# Patient Record
Sex: Male | Born: 1965 | Race: White | Marital: Single | Smoking: Never smoker
Health system: Southern US, Community
[De-identification: ages and names within clinical notes are randomized; demographics above are authoritative.]

## PROBLEM LIST (undated history)

## (undated) DIAGNOSIS — K8020 Calculus of gallbladder without cholecystitis without obstruction: Secondary | ICD-10-CM

## (undated) HISTORY — PX: APPENDECTOMY: SHX54

## (undated) HISTORY — PX: NO PAST SURGERIES: SHX2092

## (undated) HISTORY — PX: MOUTH SURGERY: SHX715

---
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
## 2014-12-12 ENCOUNTER — Other Ambulatory Visit: Payer: Self-pay | Admitting: Family Medicine

## 2014-12-12 ENCOUNTER — Ambulatory Visit
Admission: RE | Admit: 2014-12-12 | Discharge: 2014-12-12 | Disposition: A | Discharge status: Home / Self Care | Payer: 59 | Source: Ambulatory Visit | Attending: Family Medicine | Admitting: Family Medicine

## 2014-12-12 DIAGNOSIS — M79651 Pain in right thigh: Secondary | ICD-10-CM

## 2014-12-12 DIAGNOSIS — M545 Low back pain: Secondary | ICD-10-CM

## 2014-12-30 ENCOUNTER — Ambulatory Visit (HOSPITAL_COMMUNITY)
Admission: EM | Admit: 2014-12-30 | Discharge: 2015-01-01 | Disposition: A | Discharge status: Home / Self Care | Payer: 59 | Attending: General Surgery | Admitting: General Surgery

## 2014-12-30 ENCOUNTER — Encounter (HOSPITAL_COMMUNITY): Payer: Self-pay | Admitting: Neurology

## 2014-12-30 DIAGNOSIS — K8050 Calculus of bile duct without cholangitis or cholecystitis without obstruction: Secondary | ICD-10-CM | POA: Diagnosis present

## 2014-12-30 DIAGNOSIS — K8010 Calculus of gallbladder with chronic cholecystitis without obstruction: Secondary | ICD-10-CM | POA: Diagnosis present

## 2014-12-30 DIAGNOSIS — K8012 Calculus of gallbladder with acute and chronic cholecystitis without obstruction: Secondary | ICD-10-CM | POA: Diagnosis present

## 2014-12-30 DIAGNOSIS — K8020 Calculus of gallbladder without cholecystitis without obstruction: Secondary | ICD-10-CM

## 2014-12-30 HISTORY — DX: Calculus of gallbladder without cholecystitis without obstruction: K80.20

## 2014-12-30 LAB — CBC WITH DIFFERENTIAL/PLATELET
Basophils Absolute: 0.1 10*3/uL (ref 0.0–0.1)
Basophils Relative: 1 % (ref 0–1)
EOS PCT: 3 % (ref 0–5)
Eosinophils Absolute: 0.2 10*3/uL (ref 0.0–0.7)
HEMATOCRIT: 45.6 % (ref 39.0–52.0)
HEMOGLOBIN: 16.4 g/dL (ref 13.0–17.0)
LYMPHS ABS: 2.2 10*3/uL (ref 0.7–4.0)
LYMPHS PCT: 32 % (ref 12–46)
MCH: 31.4 pg (ref 26.0–34.0)
MCHC: 36 g/dL (ref 30.0–36.0)
MCV: 87.4 fL (ref 78.0–100.0)
MONO ABS: 0.4 10*3/uL (ref 0.1–1.0)
Monocytes Relative: 6 % (ref 3–12)
Neutro Abs: 4 10*3/uL (ref 1.7–7.7)
Neutrophils Relative %: 58 % (ref 43–77)
Platelets: 231 10*3/uL (ref 150–400)
RBC: 5.22 MIL/uL (ref 4.22–5.81)
RDW: 12.5 % (ref 11.5–15.5)
WBC: 6.9 10*3/uL (ref 4.0–10.5)

## 2014-12-30 LAB — URINALYSIS, ROUTINE W REFLEX MICROSCOPIC
Bilirubin Urine: NEGATIVE
Glucose, UA: NEGATIVE mg/dL
Hgb urine dipstick: NEGATIVE
Ketones, ur: NEGATIVE mg/dL
LEUKOCYTES UA: NEGATIVE
Nitrite: NEGATIVE
Protein, ur: NEGATIVE mg/dL
Specific Gravity, Urine: 1.011 (ref 1.005–1.030)
UROBILINOGEN UA: 0.2 mg/dL (ref 0.0–1.0)
pH: 7 (ref 5.0–8.0)

## 2014-12-30 LAB — COMPREHENSIVE METABOLIC PANEL
ALT: 31 U/L (ref 0–53)
ANION GAP: 9 (ref 5–15)
AST: 28 U/L (ref 0–37)
Albumin: 3.6 g/dL (ref 3.5–5.2)
Alkaline Phosphatase: 46 U/L (ref 39–117)
BUN: 12 mg/dL (ref 6–23)
CALCIUM: 9.2 mg/dL (ref 8.4–10.5)
CO2: 25 mmol/L (ref 19–32)
Chloride: 105 mmol/L (ref 96–112)
Creatinine, Ser: 1.15 mg/dL (ref 0.50–1.35)
GFR, EST AFRICAN AMERICAN: 86 mL/min — AB (ref >90)
GFR, EST NON AFRICAN AMERICAN: 74 mL/min — AB (ref >90)
Glucose, Bld: 117 mg/dL — ABNORMAL HIGH (ref 70–99)
POTASSIUM: 4 mmol/L (ref 3.5–5.1)
SODIUM: 139 mmol/L (ref 135–145)
TOTAL PROTEIN: 6.6 g/dL (ref 6.0–8.3)
Total Bilirubin: 1.1 mg/dL (ref 0.3–1.2)

## 2014-12-30 LAB — SURGICAL PCR SCREEN
MRSA, PCR: NEGATIVE
Staphylococcus aureus: NEGATIVE

## 2014-12-30 LAB — LIPASE, BLOOD: LIPASE: 24 U/L (ref 11–59)

## 2014-12-30 MED ORDER — MORPHINE SULFATE 2 MG/ML IJ SOLN: 1.0000 mg | INTRAMUSCULAR | Status: DC | PRN

## 2014-12-30 MED ORDER — DIPHENHYDRAMINE HCL 50 MG/ML IJ SOLN: 12.5000 mg | Freq: Four times a day (QID) | INTRAMUSCULAR | Status: DC | PRN

## 2014-12-30 MED ORDER — PANTOPRAZOLE SODIUM 40 MG PO TBEC
40.0000 mg | DELAYED_RELEASE_TABLET | Freq: Every day | ORAL | Status: DC
  Administered 2014-12-30 – 2015-01-01 (×3): 40 mg via ORAL
  Filled 2014-12-30 (×3): qty 1

## 2014-12-30 MED ORDER — CIPROFLOXACIN IN D5W 400 MG/200ML IV SOLN
400.0000 mg | INTRAVENOUS | Status: AC
  Administered 2014-12-31: 400 mg via INTRAVENOUS
  Filled 2014-12-30: qty 200

## 2014-12-30 MED ORDER — OXYCODONE HCL 5 MG PO TABS
5.0000 mg | ORAL_TABLET | ORAL | Status: DC | PRN
  Administered 2015-01-01: 5 mg via ORAL
  Filled 2014-12-30: qty 1

## 2014-12-30 MED ORDER — ACETAMINOPHEN 325 MG PO TABS: 650.0000 mg | ORAL_TABLET | Freq: Four times a day (QID) | ORAL | Status: DC | PRN

## 2014-12-30 MED ORDER — ONDANSETRON HCL 4 MG/2ML IJ SOLN: 4.0000 mg | Freq: Four times a day (QID) | INTRAMUSCULAR | Status: DC | PRN

## 2014-12-30 MED ORDER — DIPHENHYDRAMINE HCL 12.5 MG/5ML PO ELIX: 12.5000 mg | ORAL_SOLUTION | Freq: Four times a day (QID) | ORAL | Status: DC | PRN

## 2014-12-30 MED ORDER — ACETAMINOPHEN 650 MG RE SUPP: 650.0000 mg | Freq: Four times a day (QID) | RECTAL | Status: DC | PRN

## 2014-12-30 MED ORDER — KCL IN DEXTROSE-NACL 20-5-0.45 MEQ/L-%-% IV SOLN
INTRAVENOUS | Status: DC
  Administered 2014-12-30: 19:00:00 via INTRAVENOUS
  Filled 2014-12-30 (×3): qty 1000

## 2014-12-30 NOTE — H&P (Signed)
Wrens 1966-09-29  [id number].   Primary Care MD: Dr. Derinda Late Chief Complaint/Reason for Consult: gallstones HPI: This is a 48 yo white male with no significant past medical history except a laparotomy as a newborn for a bowel obstruction.  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  For the last several years, he has had some intermittent epigastric pain, but does not recall much about it.  This past Friday, he ate his normal breakfast and then drove down to [place] for business.  Upon arriving, the patient began having significant epigastric abdominal pain that radiated to his back.  He has nausea and vomiting.  He admits to diarrhea, but this has been a problem since last year when he was in [place] for 1/3 of the year.  He states he has been worked up with stool cultures, etc that have been negative.  He was evaluated at by the [facility] ED.  He had a CT scan that was negative except for a distended gallbladder and hepatic steatosis.  He then had an ultrasound that revealed gallstones, but no evidence of wall thickening or signs of acute cholecystitis.  His WBC was elevated at 15K, but normal LFTs.  His pain improved with what sounds like toradol and he was able to be discharged with follow up here.  He saw his PCP today who sent him to the [facility] for evaluation by [facility].  Today his CBC and LFTs are normal.  ROS : Please see HPI, otherwise negative  History reviewed. No pertinent family history.  Past Medical History  Diagnosis Date  . Gallstone     Past Surgical History  Procedure Laterality Date  . Small intestine surgery      as infant    Social History:  reports that he has never smoked. He does not have any smokeless tobacco history on file. He reports that he drinks alcohol. He reports that he does not use illicit drugs.  Allergies:  Allergies  Allergen Reactions  . Penicillins      (Not in a hospital  admission)  Blood pressure 125/88, pulse 91, temperature 98.8 F (37.1 C), temperature source Oral, resp. rate 12, height 5' 9"  (1.753 m), weight 83.915 kg (185 lb), SpO2 99 %. Physical Exam: General: pleasant, WD, WN white male who is laying in bed in NAD HEENT: head is normocephalic, atraumatic.  Sclera are noninjected.  PERRL.  Ears and nose without any masses or lesions.  Mouth is pink and moist Heart: regular, rate, and rhythm.  Normal s1,s2. No obvious murmurs, gallops, or rubs noted.  Palpable radial and pedal pulses bilaterally Lungs: CTAB, no wheezes, rhonchi, or rales noted.  Respiratory effort nonlabored Abd: soft, mild tenderness to deep palpation in RUQ, ND, +BS, no masses, hernias, or organomegaly MS: all 4 extremities are symmetrical with no cyanosis, clubbing, or edema. Skin: warm and dry with no masses, lesions, or rashes Psych: A&Ox3 with an appropriate affect.    Results for orders placed or performed during the hospital encounter of 12/30/14 (from the past 48 hour(s))  CBC with Differential     Status: None   Collection Time: 12/30/14 12:51 PM  Result Value Ref Range   WBC 6.9 4.0 - 10.5 K/uL   RBC 5.22 4.22 - 5.81 MIL/uL   Hemoglobin 16.4 13.0 - 17.0 g/dL   HCT 45.6 39.0 - 52.0 %   MCV 87.4 78.0 - 100.0 fL   MCH 31.4  26.0 - 34.0 pg   MCHC 36.0 30.0 - 36.0 g/dL   RDW 12.5 11.5 - 15.5 %   Platelets 231 150 - 400 K/uL   Neutrophils Relative % 58 43 - 77 %   Neutro Abs 4.0 1.7 - 7.7 K/uL   Lymphocytes Relative 32 12 - 46 %   Lymphs Abs 2.2 0.7 - 4.0 K/uL   Monocytes Relative 6 3 - 12 %   Monocytes Absolute 0.4 0.1 - 1.0 K/uL   Eosinophils Relative 3 0 - 5 %   Eosinophils Absolute 0.2 0.0 - 0.7 K/uL   Basophils Relative 1 0 - 1 %   Basophils Absolute 0.1 0.0 - 0.1 K/uL  Comprehensive metabolic panel     Status: Abnormal   Collection Time: 12/30/14 12:51 PM  Result Value Ref Range   Sodium 139 135 - 145 mmol/L   Potassium 4.0 3.5 - 5.1 mmol/L   Chloride 105  96 - 112 mmol/L   CO2 25 19 - 32 mmol/L   Glucose, Bld 117 (H) 70 - 99 mg/dL   BUN 12 6 - 23 mg/dL   Creatinine, Ser 1.15 0.50 - 1.35 mg/dL   Calcium 9.2 8.4 - 10.5 mg/dL   Total Protein 6.6 6.0 - 8.3 g/dL   Albumin 3.6 3.5 - 5.2 g/dL   AST 28 0 - 37 U/L   ALT 31 0 - 53 U/L   Alkaline Phosphatase 46 39 - 117 U/L   Total Bilirubin 1.1 0.3 - 1.2 mg/dL   GFR calc non Af Amer 74 (L) >90 mL/min   GFR calc Af Amer 86 (L) >90 mL/min    Comment: (NOTE) The eGFR has been calculated using the CKD EPI equation. This calculation has not been validated in all clinical situations. eGFR's persistently <90 mL/min signify possible Chronic Kidney Disease.    Anion gap 9 5 - 15  Lipase, blood     Status: None   Collection Time: 12/30/14 12:51 PM  Result Value Ref Range   Lipase 24 11 - 59 U/L   No results found.     Assessment/Plan 1. Biliary colic -admit and plan for lap chole tomorrow morning at 7:30 -IVFs -EKG to rule out any cardiac issues, although he was thoroughly worked up for his heart at the hospital in the [place] on call to OR tomorrow due to PCN allergy -clear liquids tonight and NPO P MN. -due to his prior surgery as a newborn, he is at slightly higher risk for possible complications or open procedure.  This has been thoroughly discussed by Dr. Dalbert Batman with the patient.  He understands and is agreeable to proceed to the OR tomorrow.  Anticipated outcome and expectations were explained as well.  Aleshia Cartelli E 12/30/2014, 3:02 PM Pager: [phone]

## 2014-12-30 NOTE — ED Notes (Signed)
Patient stated he was unable to provide a urine sample at this time.

## 2014-12-30 NOTE — ED Provider Notes (Signed)
Pt seen by general surgery on arrival and they directed care and admitted patient   Zadie Rhineonald Amayah Staheli, MD 12/30/14 1432

## 2014-12-30 NOTE — ED Notes (Addendum)
Pt reports on Friday you passed gallstone, today c/o RUQ. Sent here from PCP for work up. No vomiting today. Is a x 4. In NAD. Reports he had a CT scan on Friday night and results were sent over. Dr. Duaine DredgeBlomgren.

## 2014-12-31 ENCOUNTER — Observation Stay (HOSPITAL_COMMUNITY): Payer: 59 | Admitting: Certified Registered Nurse Anesthetist

## 2014-12-31 ENCOUNTER — Encounter (HOSPITAL_COMMUNITY): Payer: Self-pay | Admitting: Anesthesiology

## 2014-12-31 ENCOUNTER — Encounter (HOSPITAL_COMMUNITY): Admission: EM | Disposition: A | Discharge status: Home / Self Care | Payer: Self-pay | Source: Home / Self Care

## 2014-12-31 DIAGNOSIS — K8010 Calculus of gallbladder with chronic cholecystitis without obstruction: Secondary | ICD-10-CM | POA: Diagnosis present

## 2014-12-31 HISTORY — PX: CHOLECYSTECTOMY: SHX55

## 2014-12-31 LAB — CBC
HCT: 45.4 % (ref 39.0–52.0)
HEMOGLOBIN: 16.3 g/dL (ref 13.0–17.0)
MCH: 31.4 pg (ref 26.0–34.0)
MCHC: 35.9 g/dL (ref 30.0–36.0)
MCV: 87.5 fL (ref 78.0–100.0)
Platelets: 178 10*3/uL (ref 150–400)
RBC: 5.19 MIL/uL (ref 4.22–5.81)
RDW: 12.4 % (ref 11.5–15.5)
WBC: 9.7 10*3/uL (ref 4.0–10.5)

## 2014-12-31 LAB — CREATININE, SERUM
CREATININE: 1.31 mg/dL (ref 0.50–1.35)
GFR, EST AFRICAN AMERICAN: 73 mL/min — AB (ref >90)
GFR, EST NON AFRICAN AMERICAN: 63 mL/min — AB (ref >90)

## 2014-12-31 SURGERY — LAPAROSCOPIC CHOLECYSTECTOMY WITH INTRAOPERATIVE CHOLANGIOGRAM
Anesthesia: General | Site: Abdomen

## 2014-12-31 MED ORDER — HYDROCODONE-ACETAMINOPHEN 5-325 MG PO TABS
1.0000 | ORAL_TABLET | ORAL | Status: DC | PRN
  Administered 2014-12-31: 1 via ORAL
  Filled 2014-12-31: qty 1

## 2014-12-31 MED ORDER — PHENYLEPHRINE HCL 10 MG/ML IJ SOLN
INTRAMUSCULAR | Status: DC | PRN
  Administered 2014-12-31 (×2): 80 ug via INTRAVENOUS
  Administered 2014-12-31 (×3): 40 ug via INTRAVENOUS
  Administered 2014-12-31: 80 ug via INTRAVENOUS

## 2014-12-31 MED ORDER — PROPOFOL 10 MG/ML IV BOLUS
INTRAVENOUS | Status: AC
  Filled 2014-12-31: qty 20

## 2014-12-31 MED ORDER — HYDROMORPHONE HCL 1 MG/ML IJ SOLN: 0.2500 mg | INTRAMUSCULAR | Status: DC | PRN

## 2014-12-31 MED ORDER — PROPOFOL 10 MG/ML IV BOLUS
INTRAVENOUS | Status: DC | PRN
  Administered 2014-12-31: 100 mg via INTRAVENOUS
  Administered 2014-12-31: 200 mg via INTRAVENOUS

## 2014-12-31 MED ORDER — POTASSIUM CHLORIDE IN NACL 20-0.9 MEQ/L-% IV SOLN
INTRAVENOUS | Status: DC
  Administered 2014-12-31 – 2015-01-01 (×3): via INTRAVENOUS
  Filled 2014-12-31 (×5): qty 1000

## 2014-12-31 MED ORDER — SODIUM CHLORIDE 0.9 % IJ SOLN
INTRAMUSCULAR | Status: AC
  Filled 2014-12-31: qty 10

## 2014-12-31 MED ORDER — GLYCOPYRROLATE 0.2 MG/ML IJ SOLN
INTRAMUSCULAR | Status: DC | PRN
  Administered 2014-12-31: .4 mg via INTRAVENOUS

## 2014-12-31 MED ORDER — SUCCINYLCHOLINE CHLORIDE 20 MG/ML IJ SOLN
INTRAMUSCULAR | Status: AC
  Filled 2014-12-31: qty 1

## 2014-12-31 MED ORDER — MIDAZOLAM HCL 2 MG/2ML IJ SOLN
INTRAMUSCULAR | Status: AC
  Filled 2014-12-31: qty 2

## 2014-12-31 MED ORDER — ROCURONIUM BROMIDE 100 MG/10ML IV SOLN
INTRAVENOUS | Status: DC | PRN
  Administered 2014-12-31: 25 mg via INTRAVENOUS

## 2014-12-31 MED ORDER — LIDOCAINE HCL (CARDIAC) 20 MG/ML IV SOLN
INTRAVENOUS | Status: AC
  Filled 2014-12-31: qty 25

## 2014-12-31 MED ORDER — HEMOSTATIC AGENTS (NO CHARGE) OPTIME
TOPICAL | Status: DC | PRN
  Administered 2014-12-31: 1 via TOPICAL

## 2014-12-31 MED ORDER — ONDANSETRON HCL 4 MG/2ML IJ SOLN
INTRAMUSCULAR | Status: AC
  Filled 2014-12-31: qty 2

## 2014-12-31 MED ORDER — EPHEDRINE SULFATE 50 MG/ML IJ SOLN
INTRAMUSCULAR | Status: AC
  Filled 2014-12-31: qty 1

## 2014-12-31 MED ORDER — ROCURONIUM BROMIDE 50 MG/5ML IV SOLN
INTRAVENOUS | Status: AC
  Filled 2014-12-31: qty 2

## 2014-12-31 MED ORDER — DEXAMETHASONE SODIUM PHOSPHATE 4 MG/ML IJ SOLN
INTRAMUSCULAR | Status: AC
  Filled 2014-12-31: qty 1

## 2014-12-31 MED ORDER — ONDANSETRON HCL 4 MG/2ML IJ SOLN
INTRAMUSCULAR | Status: DC | PRN
  Administered 2014-12-31: 4 mg via INTRAVENOUS

## 2014-12-31 MED ORDER — ENOXAPARIN SODIUM 40 MG/0.4ML ~~LOC~~ SOLN
40.0000 mg | SUBCUTANEOUS | Status: DC
  Administered 2015-01-01: 40 mg via SUBCUTANEOUS
  Filled 2014-12-31: qty 0.4

## 2014-12-31 MED ORDER — HYDROMORPHONE HCL 1 MG/ML IJ SOLN: 1.0000 mg | INTRAMUSCULAR | Status: DC | PRN

## 2014-12-31 MED ORDER — LIDOCAINE HCL 4 % MT SOLN
OROMUCOSAL | Status: DC | PRN
  Administered 2014-12-31: 4 mL via TOPICAL

## 2014-12-31 MED ORDER — FENTANYL CITRATE 0.05 MG/ML IJ SOLN
INTRAMUSCULAR | Status: AC
  Filled 2014-12-31: qty 5

## 2014-12-31 MED ORDER — KETOROLAC TROMETHAMINE 30 MG/ML IJ SOLN
30.0000 mg | Freq: Once | INTRAMUSCULAR | Status: AC
  Administered 2014-12-31: 30 mg via INTRAVENOUS
  Filled 2014-12-31: qty 1

## 2014-12-31 MED ORDER — NEOSTIGMINE METHYLSULFATE 10 MG/10ML IV SOLN
INTRAVENOUS | Status: DC | PRN
  Administered 2014-12-31: 3 mg via INTRAVENOUS

## 2014-12-31 MED ORDER — FENTANYL CITRATE 0.05 MG/ML IJ SOLN
INTRAMUSCULAR | Status: DC | PRN
  Administered 2014-12-31: 50 ug via INTRAVENOUS
  Administered 2014-12-31: 100 ug via INTRAVENOUS
  Administered 2014-12-31 (×2): 25 ug via INTRAVENOUS
  Administered 2014-12-31: 50 ug via INTRAVENOUS

## 2014-12-31 MED ORDER — LIDOCAINE HCL (CARDIAC) 20 MG/ML IV SOLN
INTRAVENOUS | Status: DC | PRN
  Administered 2014-12-31: 100 mg via INTRAVENOUS

## 2014-12-31 MED ORDER — SODIUM CHLORIDE 0.9 % IV SOLN
INTRAVENOUS | Status: DC | PRN
  Administered 2014-12-31: 07:00:00

## 2014-12-31 MED ORDER — BUPIVACAINE-EPINEPHRINE 0.5% -1:200000 IJ SOLN
INTRAMUSCULAR | Status: DC | PRN
  Administered 2014-12-31: 30 mL

## 2014-12-31 MED ORDER — NEOSTIGMINE METHYLSULFATE 10 MG/10ML IV SOLN
INTRAVENOUS | Status: AC
Start: 2014-12-31 — End: 2014-12-31
  Filled 2014-12-31: qty 1

## 2014-12-31 MED ORDER — ONDANSETRON HCL 4 MG PO TABS: 4.0000 mg | ORAL_TABLET | Freq: Four times a day (QID) | ORAL | Status: DC | PRN

## 2014-12-31 MED ORDER — BUPIVACAINE-EPINEPHRINE (PF) 0.5% -1:200000 IJ SOLN
INTRAMUSCULAR | Status: AC
  Filled 2014-12-31: qty 30

## 2014-12-31 MED ORDER — GLYCOPYRROLATE 0.2 MG/ML IJ SOLN
INTRAMUSCULAR | Status: AC
  Filled 2014-12-31: qty 1

## 2014-12-31 MED ORDER — LACTATED RINGERS IV SOLN
INTRAVENOUS | Status: DC | PRN
  Administered 2014-12-31: 07:00:00 via INTRAVENOUS

## 2014-12-31 MED ORDER — PHENYLEPHRINE 40 MCG/ML (10ML) SYRINGE FOR IV PUSH (FOR BLOOD PRESSURE SUPPORT)
PREFILLED_SYRINGE | INTRAVENOUS | Status: AC
  Filled 2014-12-31: qty 20

## 2014-12-31 MED ORDER — 0.9 % SODIUM CHLORIDE (POUR BTL) OPTIME
TOPICAL | Status: DC | PRN
  Administered 2014-12-31: 1000 mL

## 2014-12-31 MED ORDER — OXYCODONE HCL 5 MG PO TABS
5.0000 mg | ORAL_TABLET | Freq: Four times a day (QID) | ORAL | Status: DC | PRN
Start: 2014-12-31 — End: 2015-01-01

## 2014-12-31 MED ORDER — SODIUM CHLORIDE 0.9 % IR SOLN
Status: DC | PRN
  Administered 2014-12-31: 1000 mL

## 2014-12-31 MED ORDER — ONDANSETRON HCL 4 MG/2ML IJ SOLN: 4.0000 mg | Freq: Once | INTRAMUSCULAR | Status: DC | PRN

## 2014-12-31 MED ORDER — GLYCOPYRROLATE 0.2 MG/ML IJ SOLN
INTRAMUSCULAR | Status: AC
  Filled 2014-12-31: qty 2

## 2014-12-31 MED ORDER — ONDANSETRON HCL 4 MG/2ML IJ SOLN: 4.0000 mg | Freq: Four times a day (QID) | INTRAMUSCULAR | Status: DC | PRN

## 2014-12-31 SURGICAL SUPPLY — 48 items
APPLIER CLIP ROT 10 11.4 M/L (STAPLE) ×3
BLADE SURG ROTATE 9660 (MISCELLANEOUS) IMPLANT
CANISTER SUCTION 2500CC (MISCELLANEOUS) ×3 IMPLANT
CHLORAPREP W/TINT 26ML (MISCELLANEOUS) ×3 IMPLANT
CLIP APPLIE ROT 10 11.4 M/L (STAPLE) ×1 IMPLANT
COVER MAYO STAND STRL (DRAPES) ×3 IMPLANT
COVER SURGICAL LIGHT HANDLE (MISCELLANEOUS) ×3 IMPLANT
DRAPE C-ARM 42X72 X-RAY (DRAPES) ×3 IMPLANT
DRAPE LAPAROSCOPIC ABDOMINAL (DRAPES) ×3 IMPLANT
DRAPE PROXIMA HALF (DRAPES) ×3 IMPLANT
ELECT REM PT RETURN 9FT ADLT (ELECTROSURGICAL) ×3
ELECTRODE REM PT RTRN 9FT ADLT (ELECTROSURGICAL) ×1 IMPLANT
GLOVE BIO SURGEON STRL SZ 6.5 (GLOVE) ×2 IMPLANT
GLOVE BIO SURGEON STRL SZ7.5 (GLOVE) ×9 IMPLANT
GLOVE BIO SURGEON STRL SZ8.5 (GLOVE) ×3 IMPLANT
GLOVE BIO SURGEONS STRL SZ 6.5 (GLOVE) ×1
GLOVE BIOGEL PI IND STRL 7.0 (GLOVE) ×2 IMPLANT
GLOVE BIOGEL PI IND STRL 7.5 (GLOVE) ×2 IMPLANT
GLOVE BIOGEL PI IND STRL 8.5 (GLOVE) ×1 IMPLANT
GLOVE BIOGEL PI INDICATOR 7.0 (GLOVE) ×4
GLOVE BIOGEL PI INDICATOR 7.5 (GLOVE) ×4
GLOVE BIOGEL PI INDICATOR 8.5 (GLOVE) ×2
GLOVE EUDERMIC 7 POWDERFREE (GLOVE) ×3 IMPLANT
GLOVE SURG SS PI 7.0 STRL IVOR (GLOVE) ×3 IMPLANT
GOWN STRL REUS W/ TWL LRG LVL3 (GOWN DISPOSABLE) ×5 IMPLANT
GOWN STRL REUS W/ TWL XL LVL3 (GOWN DISPOSABLE) ×1 IMPLANT
GOWN STRL REUS W/TWL LRG LVL3 (GOWN DISPOSABLE) ×10
GOWN STRL REUS W/TWL XL LVL3 (GOWN DISPOSABLE) ×2
HEMOSTAT SNOW SURGICEL 2X4 (HEMOSTASIS) ×3 IMPLANT
KIT BASIN OR (CUSTOM PROCEDURE TRAY) ×3 IMPLANT
KIT ROOM TURNOVER OR (KITS) ×3 IMPLANT
LIQUID BAND (GAUZE/BANDAGES/DRESSINGS) ×3 IMPLANT
NS IRRIG 1000ML POUR BTL (IV SOLUTION) ×3 IMPLANT
PAD ARMBOARD 7.5X6 YLW CONV (MISCELLANEOUS) ×3 IMPLANT
POUCH SPECIMEN RETRIEVAL 10MM (ENDOMECHANICALS) ×6 IMPLANT
SCISSORS LAP 5X35 DISP (ENDOMECHANICALS) ×3 IMPLANT
SET CHOLANGIOGRAPH 5 50 .035 (SET/KITS/TRAYS/PACK) ×3 IMPLANT
SET IRRIG TUBING LAPAROSCOPIC (IRRIGATION / IRRIGATOR) ×3 IMPLANT
SLEEVE ENDOPATH XCEL 5M (ENDOMECHANICALS) ×3 IMPLANT
SPECIMEN JAR SMALL (MISCELLANEOUS) ×3 IMPLANT
SUT MNCRL AB 4-0 PS2 18 (SUTURE) ×3 IMPLANT
TOWEL OR 17X24 6PK STRL BLUE (TOWEL DISPOSABLE) ×3 IMPLANT
TOWEL OR 17X26 10 PK STRL BLUE (TOWEL DISPOSABLE) ×3 IMPLANT
TRAY LAPAROSCOPIC (CUSTOM PROCEDURE TRAY) ×3 IMPLANT
TROCAR XCEL BLUNT TIP 100MML (ENDOMECHANICALS) ×3 IMPLANT
TROCAR XCEL NON-BLD 11X100MML (ENDOMECHANICALS) ×3 IMPLANT
TROCAR XCEL NON-BLD 5MMX100MML (ENDOMECHANICALS) ×3 IMPLANT
TUBING INSUFFLATION (TUBING) ×3 IMPLANT

## 2014-12-31 NOTE — Discharge Instructions (Signed)

## 2014-12-31 NOTE — Anesthesia Preprocedure Evaluation (Signed)
Anesthesia Evaluation  Patient identified by MRN, date of birth, ID band Patient awake    Reviewed: Allergy & Precautions, NPO status , Patient's Chart, lab work & pertinent test results  Airway        Dental   Pulmonary          Cardiovascular     Neuro/Psych    GI/Hepatic   Endo/Other    Renal/GU      Musculoskeletal   Abdominal   Peds  Hematology   Anesthesia Other Findings   Reproductive/Obstetrics                             Anesthesia Physical Anesthesia Plan  ASA: I  Anesthesia Plan: General   Post-op Pain Management:    Induction: Intravenous  Airway Management Planned: Oral ETT  Additional Equipment:   Intra-op Plan:   Post-operative Plan: Extubation in OR  Informed Consent: I have reviewed the patients History and Physical, chart, labs and discussed the procedure including the risks, benefits and alternatives for the proposed anesthesia with the patient or authorized representative who has indicated his/her understanding and acceptance.     Plan Discussed with: CRNA, Anesthesiologist and Surgeon  Anesthesia Plan Comments:         Anesthesia Quick Evaluation  

## 2014-12-31 NOTE — Discharge Summary (Signed)
  [facility] Discharge Summary   Patient ID: Norman SchaumannMichael Murray MRN: [id number] DOB/AGE: 1966-04-21 49 y.o.  Admit date: 12/30/2014 Discharge date: 01/01/2015  Admitting Diagnosis: Cholecystitis with cholelithiasis Biliary colic  Discharge Diagnosis Patient Active Problem List   Diagnosis Date Noted  . Cholecystitis with cholelithiasis 12/31/2014  . Biliary colic 12/30/2014    Consultants None  Imaging: No results found.  Procedures Dr. Derrell LollingIngram (12/31/14) - Laparoscopic Cholecystectomy (NO IOC)   Hospital Course:  49 yo white male with no significant past medical history except a laparotomy as a newborn for a bowel obstruction. 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 For the last several years, he has had some intermittent epigastric pain, but does not recall much about it. This past Friday, he ate his normal breakfast and then drove down to [place] for business. Upon arriving, the patient began having significant epigastric abdominal pain that radiated to his back. He has nausea and vomiting. He admits to diarrhea, but this has been a problem since last year when he was in [place] for 1/3 of the year. He states he has been worked up with stool cultures, etc that have been negative. He was evaluated at by the [facility] ED. He had a CT scan that was negative except for a distended gallbladder and hepatic steatosis. He then had an ultrasound that revealed gallstones, but no evidence of wall thickening or signs of acute cholecystitis. His WBC was elevated at 15K, but normal LFTs. His pain improved with what sounds like toradol and he was able to be discharged with follow up here. He saw his PCP today who [facility]. Today his CBC and LFTs are normal.  Patient was admitted and underwent procedure listed above on HD #2.  Tolerated procedure well and was transferred to the floor.   Diet was advanced as tolerated.  On POD #1/HD#3, the patient was voiding well, tolerating diet, ambulating well, pain well controlled, vital signs stable, incisions c/d/i and felt stable for discharge home.  Patient will follow up in our office in 2 weeks and knows to call with questions or concerns. He did not want his prescription for oxycodone.     Physical Exam: General:  Alert, NAD, pleasant, comfortable Abd:  Soft, ND, mild tenderness, incisions C/D/I, dermabond in place     Medication List    TAKE these medications        ibuprofen 200 MG tablet  Commonly known as:  ADVIL  Take 3-4 tablets (600-800 mg total) by mouth every 8 (eight) hours as needed.         Follow-up Information    Follow up with [facility] [place] On 01/14/2015.   Why:  For post-operation check Your appointment is at 1:30pm, please arrive at least 30 min before your appointment to complete your check in paperwork.  If you are unable to arrive 30 min prior to your appointment time we may have to cancel or reschedule you.   Contact information:   [address] [place] [id number] [phone]      Signed: Aris GeorgiaMegan Dort, [facility] [phone]  01/01/2015, 9:16 AM

## 2014-12-31 NOTE — Anesthesia Procedure Notes (Signed)
Procedure Name: Intubation Date/Time: 12/31/2014 7:43 AM Performed by: Merdis Delay Pre-anesthesia Checklist: Patient identified, Timeout performed, Emergency Drugs available, Suction available and Patient being monitored Patient Re-evaluated:Patient Re-evaluated prior to inductionOxygen Delivery Method: Circle system utilized Preoxygenation: Pre-oxygenation with 100% oxygen Intubation Type: IV induction Ventilation: Mask ventilation without difficulty Laryngoscope Size: Mac and 4 Grade View: Grade III Tube type: Oral Tube size: 7.5 mm Number of attempts: 1 Airway Equipment and Method: Stylet and LTA kit utilized Placement Confirmation: ETT inserted through vocal cords under direct vision,  breath sounds checked- equal and bilateral,  positive ETCO2 and CO2 detector Secured at: 22 cm Tube secured with: Tape Dental Injury: Teeth and Oropharynx as per pre-operative assessment

## 2014-12-31 NOTE — Anesthesia Postprocedure Evaluation (Signed)
  Anesthesia Post-op Note  Patient: Norman SchaumannMichael Murray  Procedure(s) Performed: Procedure(s): LAPAROSCOPIC CHOLECYSTECTOMY  (N/A)  Patient Location: PACU  Anesthesia Type:General  Level of Consciousness: awake, alert , oriented and patient cooperative  Airway and Oxygen Therapy: Patient Spontanous Breathing  Post-op Pain: mild  Post-op Assessment: Post-op Vital signs reviewed, Patient's Cardiovascular Status Stable, Respiratory Function Stable, Patent Airway, No signs of Nausea or vomiting and Pain level controlled  Post-op Vital Signs: stable  Last Vitals:  Filed Vitals:   12/31/14 0915  BP:   Pulse:   Temp: 36.4 C  Resp:     Complications: No apparent anesthesia complications

## 2014-12-31 NOTE — Transfer of Care (Signed)
Immediate Anesthesia Transfer of Care Note  Patient: Norman SchaumannMichael Murray  Procedure(s) Performed: Procedure(s): LAPAROSCOPIC CHOLECYSTECTOMY  (N/A)  Patient Location: PACU  Anesthesia Type:General  Level of Consciousness: awake, alert  and oriented  Airway & Oxygen Therapy: Patient Spontanous Breathing  Post-op Assessment: Report given to RN and Post -op Vital signs reviewed and stable  Post vital signs: Reviewed and stable  Last Vitals:  Filed Vitals:   12/31/14 0518  BP: 109/68  Pulse: 71  Temp: 36.7 C  Resp: 16    Complications: No apparent anesthesia complications

## 2014-12-31 NOTE — Op Note (Signed)
Patient Name:           Norman Murray   Date of Surgery:        12/31/2014  Pre op Diagnosis:      Subacute cholecystitis with cholelithiasis   Post op Diagnosis:    Same  Procedure:                 Laparoscopic cholecystectomy  Surgeon:                     Angelia Mould. Derrell Lolling, M.D., FACS  Assistant:                      Aris [place], [place]  Operative Indications:   This is a 49 year old man with a history of a laparotomy as a newborn for bowel obstruction, possible atresia. He is otherwise well. He has had some vague upper abdominal discomforts over the past few years. [place] epigastric pain radiating to his back with nausea and vomiting. He went to an emergency department in [place], [place] , where white count was 15,000, liver function tests normal. CT scan negative except for distended gallbladder and fatty liver. Ultrasound revealed gallstones but no wall thickening. He was discharged home. He has felt better but still is a little uncomfortable. He was evaluated yesterday and found to be minimally tender. Repeat liver function tests were normal and white blood cell count was back to normal. It was his desire to come in the hospital and go ahead with cholecystectomy.  Operative Findings:       The gallbladder was inflamed. It was covered with adhesions, which fortunately could be dissected away. Gallbladder wall is somewhat thickened but there was no necrosis or gangrene. The anatomy of the cystic duct and cystic artery were conventional. The liver looked healthy. There were a lot of adhesions in the lower abdomen, but I was able to put a Hassan trocar in above the umbilicus without any adhesions. Inspection after the case revealed no evidence of any injury to the intestinal tract.  Procedure in Detail:          Following the induction of general endotracheal anesthesia, intravenous antibiotics were given, the patient's abdomen  was prepped and draped in a sterile fashion, and a surgical timeout was performed. 0.5% Marcaine with epinephrine was used as a local infiltration anesthetic.    A vertical incision was made at the superior rim of the umbilicus. The fascia was carefully incised in the midline. The abdominal cavity was entered under direct vision and there were no adhesions immediately below. I could feel within the abdomen I can feel there were some adhesions inferiorly but superiorly there were no adhesions. An 11 mm Hassan trocar was inserted and secured with a Purstring suture of 0 Vicryl. Pneumoperitoneum was created. Video camera was inserted. An 11 mm trochars placed in subxiphoid region and two 5 mm trochars placed in the right upper quadrant. We looked around and saw no other abnormalities other than the lower abdominal adhesions. I could see the tip of the fundus of the gallbladder and lifted that up slowly took all of the omental adhesions down off of the gallbladder. With retraction I incised the peritoneum over the neck of the gallbladder. I dissected out the cystic duct and the cystic artery and created a large window behind both structures until I could see two and only 2  structures going to the gallbladder. I then secured the cystic artery and the cystic duct with multiple medical clips and divided them. The gallbladder was dissected from its bed with electrocautery placed in a specimen bag and removed. I made one small hole in the gallbladder a little bit of bile was spilled but no stones. The bed of the gallbladder was raw, consistent with recent information. This required some cautery. At the completion of the case there was no bleeding or bile  leak whatsoever. I placed a piece of SNOW  hemostatic sponge in the bed of the gallbladder and after 5 minutes there was no bleeding. I removed all the irrigation fluid. The trochars were removed under direct vision. There was no bleeding from trocar sites. The fascia at  the umbilicus was closed with 0 Vicryl sutures and the skin closed with subcuticular 4-0 Monocryl and Dermabond.         The patient tolerated the procedure well was taken to PACU in stable condition. EBL 20 mL. Counts correct. Complications none.       Angelia MouldHaywood M. Derrell LollingIngram, M.D., FACS General and Minimally Invasive Surgery Breast and Colorectal Surgery  12/31/2014 8:26 AM

## 2015-01-01 ENCOUNTER — Encounter (HOSPITAL_COMMUNITY): Payer: Self-pay | Admitting: General Surgery

## 2015-01-01 MED ORDER — IBUPROFEN 200 MG PO TABS: 600.0000 mg | ORAL_TABLET | Freq: Three times a day (TID) | ORAL | Status: AC | PRN

## 2015-01-01 MED ORDER — KETOROLAC TROMETHAMINE 30 MG/ML IJ SOLN
30.0000 mg | Freq: Once | INTRAMUSCULAR | Status: AC
  Administered 2015-01-01: 30 mg via INTRAVENOUS
  Filled 2015-01-01: qty 1

## 2015-01-01 NOTE — Progress Notes (Signed)
D/c to home via wheelchair with friend. Pain med given for d/c and instructions with prescriptions given and demonstrated understanding by patient. VSS. Breathing regular and unlabored on room air

## 2017-01-05 IMAGING — CR DG LUMBAR SPINE COMPLETE 4+V
5 series · 5 of 5 positions shown · non-contrast
Comparison: None.

CLINICAL DATA: Low back pain. Right thigh pain. No recent injury.
Initial evaluation.

EXAM:
LUMBAR SPINE - COMPLETE 4+ VIEW

[t l-spine a.p.]
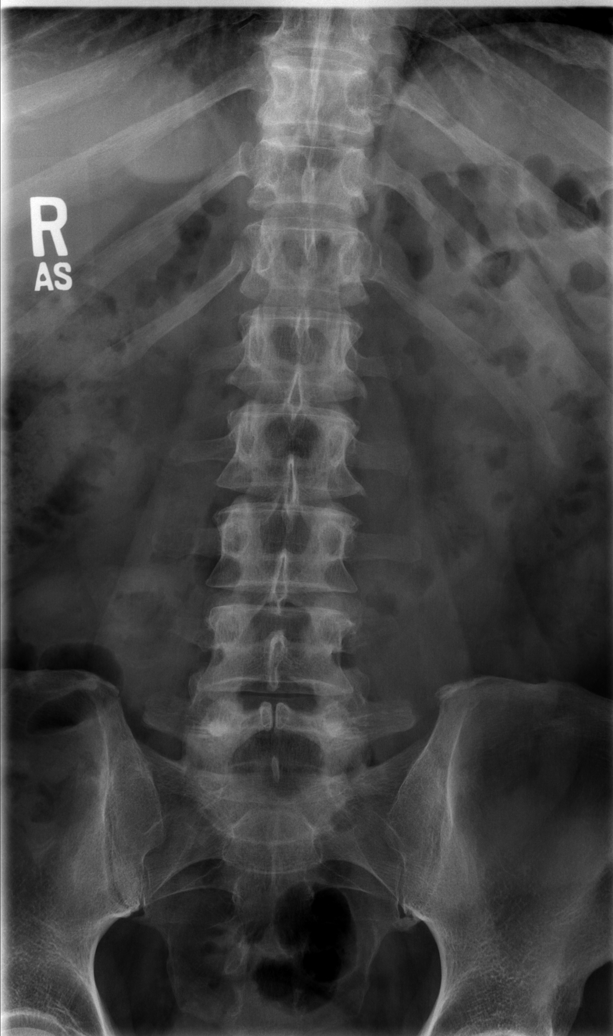

[t l-spine oblique exposure (1 of 2)]
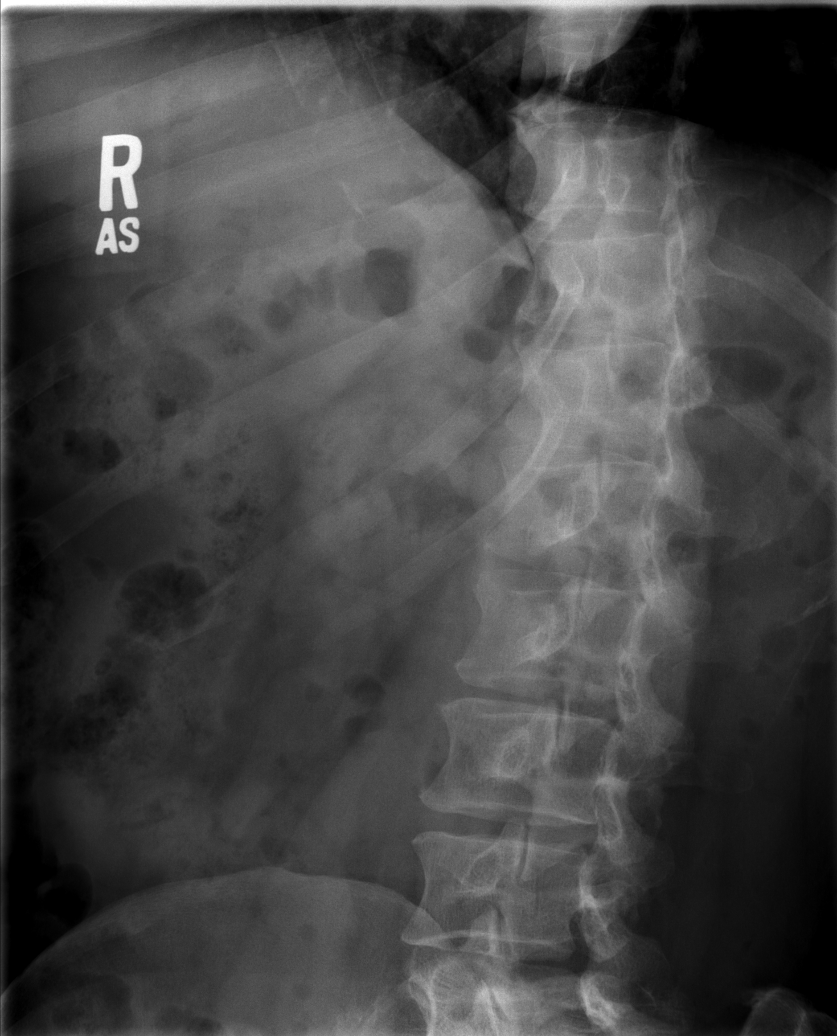

[t l-spine oblique exposure (2 of 2)]
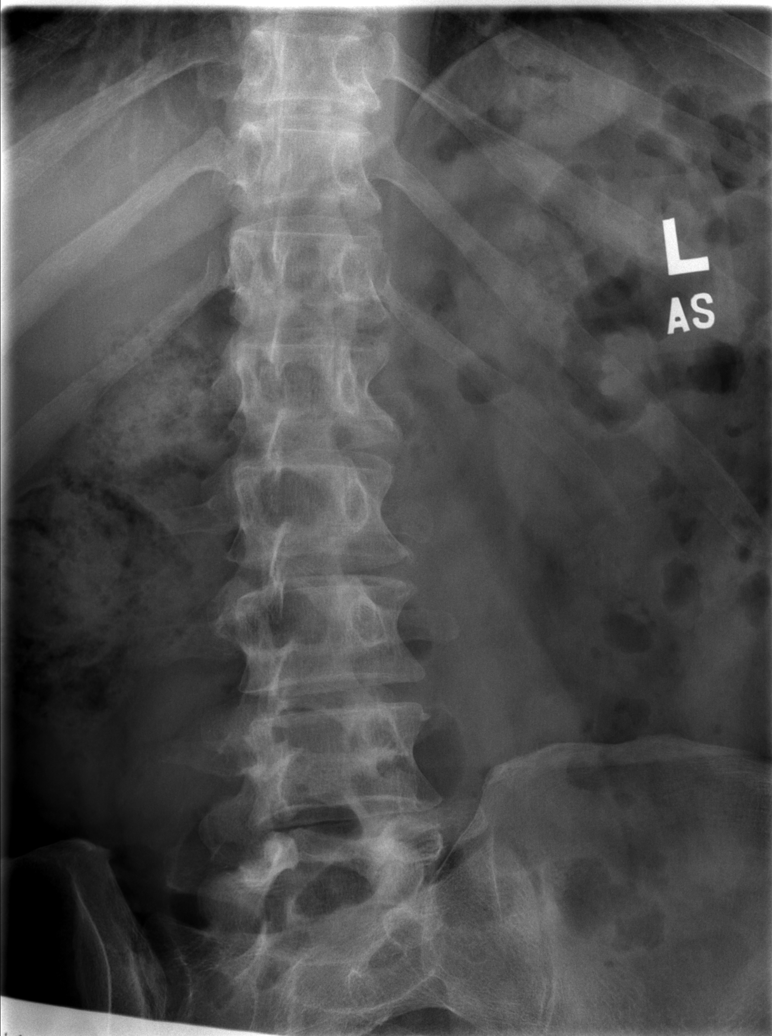

[t l-spine lat]
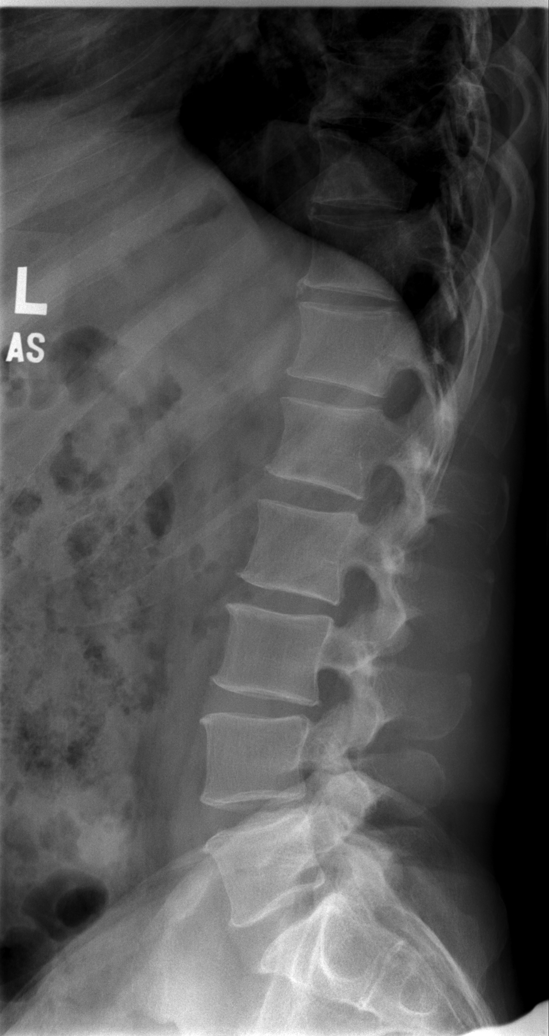

[t l-spine l5-s1 spot]
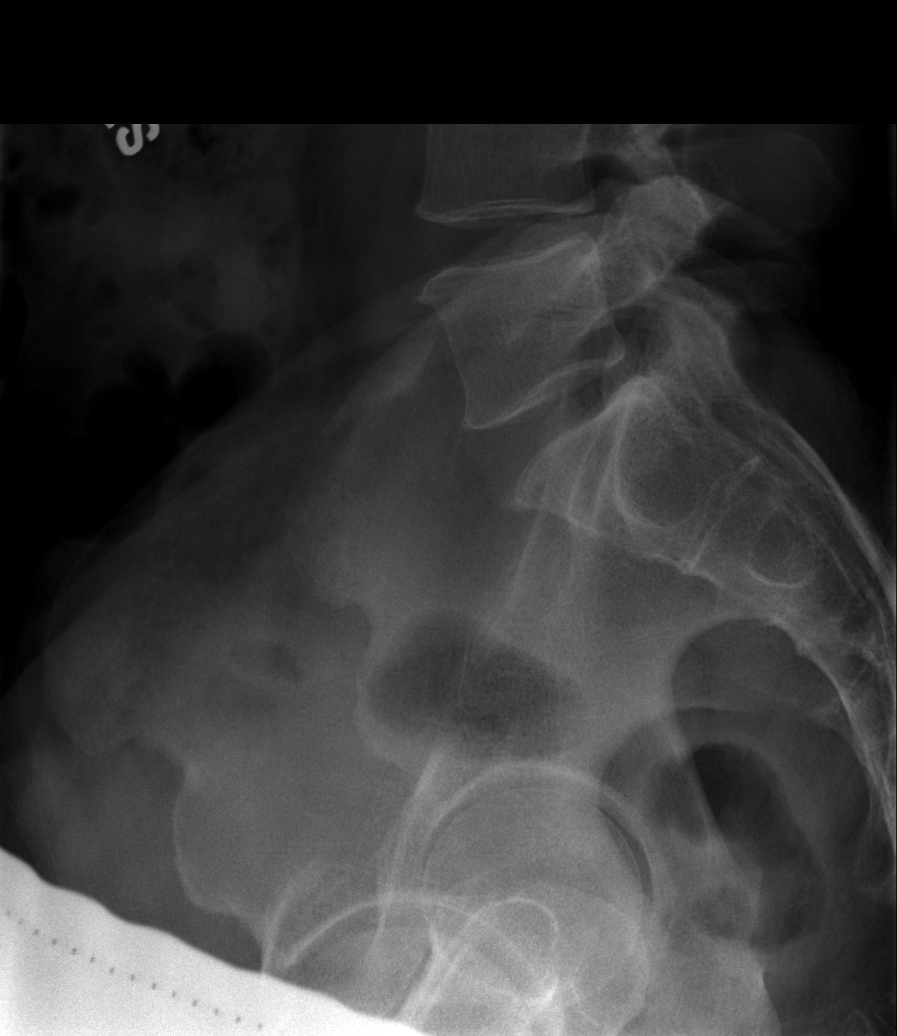

[5 of 5 positions shown; findings below may reference images not displayed]

FINDINGS: Soft tissue structures are unremarkable. No acute bony abnormality.
Spina bifida occulta L5. Mild diffuse degenerative change. Normal
mineralization and alignment.
IMPRESSION: 1. No acute abnormality.
2. Mild diffuse degenerative change.
